# Patient Record
Sex: Male | Born: 1970 | Race: White | Hispanic: No | Marital: Married | Smoking: Former smoker
Health system: Southern US, Community
[De-identification: ages and names within clinical notes are randomized; demographics above are authoritative.]

## PROBLEM LIST (undated history)

## (undated) DIAGNOSIS — Z9981 Dependence on supplemental oxygen: Secondary | ICD-10-CM

## (undated) DIAGNOSIS — I10 Essential (primary) hypertension: Secondary | ICD-10-CM

---

## 2016-02-04 ENCOUNTER — Emergency Department (HOSPITAL_COMMUNITY)
Admission: EM | Admit: 2016-02-04 | Discharge: 2016-02-05 | Disposition: A | Discharge status: Home / Self Care | Payer: Medicare Other | Attending: Emergency Medicine | Admitting: Emergency Medicine

## 2016-02-04 ENCOUNTER — Encounter (HOSPITAL_COMMUNITY): Payer: Self-pay | Admitting: *Deleted

## 2016-02-04 DIAGNOSIS — I10 Essential (primary) hypertension: Secondary | ICD-10-CM | POA: Diagnosis not present

## 2016-02-04 DIAGNOSIS — Z79899 Other long term (current) drug therapy: Secondary | ICD-10-CM | POA: Insufficient documentation

## 2016-02-04 DIAGNOSIS — Z87891 Personal history of nicotine dependence: Secondary | ICD-10-CM | POA: Diagnosis not present

## 2016-02-04 DIAGNOSIS — R0602 Shortness of breath: Secondary | ICD-10-CM

## 2016-02-04 HISTORY — DX: Cystic fibrosis, unspecified: E84.9

## 2016-02-04 HISTORY — DX: Dependence on supplemental oxygen: Z99.81

## 2016-02-04 HISTORY — DX: Essential (primary) hypertension: I10
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
## 2016-02-05 DIAGNOSIS — R0602 Shortness of breath: Secondary | ICD-10-CM | POA: Diagnosis not present

## 2016-02-05 MED ORDER — ALBUTEROL SULFATE HFA 108 (90 BASE) MCG/ACT IN AERS
2.0000 | INHALATION_SPRAY | RESPIRATORY_TRACT | Status: AC
  Administered 2016-02-05: 2 via RESPIRATORY_TRACT
  Filled 2016-02-05: qty 6.7

## 2016-02-05 NOTE — ED Provider Notes (Signed)
CSN: [id number]     Arrival date & time 02/04/16  2343 History  By signing my name below, I, [facility], attest that this documentation has been prepared under the direction and in the presence of Donald HongBrian Damia Bobrowski, MD. Electronically Signed: Randell PatientMarrissa Gomez, ED Scribe. 02/05/2016. 12:20 AM.   Chief Complaint  Gomez presents with  . Shortness of Breath    The history is provided by the Gomez. No language interpreter was used.   HPI Comments: Donald Gomez is a 45 y.o. male who presents to the Emergency Department complaining of mild, unchanging SOB onset earlier today. Pt states that he has CF and notes that he returned from a trip from [place] without his medications. He regularly takes an inhaled steroid and last took his medications 1 day ago at 4 PM. He is on home O2 at night. Denies fevers, chills, nausea, vomiting, diarrhea, abdominal pain, blurred vision, CP, back pain, neck pain, or any other symptoms currently.  Past Medical History  Diagnosis Date  . Cystic fibrosis (HCC)   . Hypertension   . Oxygen dependent     2 liters at night,    History reviewed. No pertinent past surgical history. No family history on file. Social History  Substance Use Topics  . Smoking status: Former Games developermoker  . Smokeless tobacco: None  . Alcohol Use: No    Review of Systems  Constitutional: Negative for fever and chills.  Respiratory: Positive for shortness of breath.   Cardiovascular: Negative for chest pain.  Gastrointestinal: Negative for nausea, vomiting, abdominal pain and diarrhea.  Musculoskeletal: Negative for back pain and neck pain.  All other systems reviewed and are negative.     Allergies  Review of Gomez's allergies indicates no known allergies.  Home Medications   Prior to Admission medications   Medication Sig Start Date End Date Taking? Authorizing Provider  albuterol-ipratropium (COMBIVENT) 18-103 MCG/ACT inhaler Inhale into the lungs every 4 (four) hours.    Yes Historical Provider, MD   BP 121/101 mmHg  Pulse 113  Temp(Src) 98.3 F (36.8 C) (Oral)  Resp 24  Ht 5\' 6"  (1.676 m)  Wt 221 lb (100.245 kg)  BMI 35.69 kg/m2  SpO2 92% Physical Exam  Constitutional: He appears well-developed and well-nourished.  HENT:  Head: Normocephalic and atraumatic.  Eyes: Conjunctivae are normal. Right eye exhibits no discharge. Left eye exhibits no discharge.  Pulmonary/Chest: Effort normal. No respiratory distress. He has wheezes.  Mild diffuse expiratory wheezing. Otherwise benign pulmonary exam without respiratory distress.  Neurological: He is alert. Coordination normal.  Skin: Skin is warm and dry. No rash noted. He is not diaphoretic. No erythema.  Psychiatric: He has a normal mood and affect.  Nursing note and vitals reviewed.   ED Course  Procedures   DIAGNOSTIC STUDIES: Oxygen Saturation is 92% on RA, normal  by my interpretation.    COORDINATION OF CARE: 12:13 AM Will order albuterol inhaler. Will discharge pt. Discussed treatment plan with pt at bedside and pt agreed to plan.   Labs Review Labs Reviewed - No data to display  Imaging Review No results found. I have personally reviewed and evaluated these images and lab results as part of my medical decision-making.    MDM   Final diagnoses:  Shortness of breath    meds refilled - requesting albuterol - no distress - on baseline O2 and basline sat's according to pt - no new sx.  I personally performed the services described in this documentation, which  was scribed in my presence. The recorded information has been reviewed and is accurate.       Donald HongBrian Jaloni Davoli, MD 02/05/16 2337

## 2016-02-05 NOTE — Discharge Instructions (Signed)
Please obtain all of your results from medical records or have your doctors office obtain the results - share them with your doctor - you should be seen at your doctors office in the next 2 days. Call today to arrange your follow up. Take the medications as prescribed. Please review all of the medicines and only take them if you do not have an allergy to them. Please be aware that if you are taking birth control pills, taking other prescriptions, ESPECIALLY ANTIBIOTICS may make the birth control ineffective - if this is the case, either do not engage in sexual activity or use alternative methods of birth control such as condoms until you have finished the medicine and your family doctor says it is OK to restart them. If you are on a blood thinner such as COUMADIN, be aware that any other medicine that you take may cause the coumadin to either work too much, or not enough - you should have your coumadin level rechecked in next 7 days if this is the case.  °?  °It is also a possibility that you have an allergic reaction to any of the medicines that you have been prescribed - Everybody reacts differently to medications and while MOST people have no trouble with most medicines, you may have a reaction such as nausea, vomiting, rash, swelling, shortness of breath. If this is the case, please stop taking the medicine immediately and contact your physician.  °?  °You should return to the ER if you develop severe or worsening symptoms.  ° °[facility] Doctor List ° ° ° °Edward Hawkins MD. Specialty: Pulmonary Disease Contact information: [address]  °[address]  °[place] [address]  °[phone]  ° °Margaret Simpson, MD. Specialty: Family Medicine Contact information: [address]  °[place] [address]  °[phone]  ° °Scott Luking, MD. Specialty: Family Medicine Contact information: [address]  °[address]  °[place] [address]  °[phone]  ° °Tesfaye Fanta, MD Specialty:  Internal Medicine Contact information: [address]  °[place] [address]  °[phone]  ° °Zach Hall, MD. Specialty: Internal Medicine Contact information: [address]  °[place] [address]  °[phone]  ° °Angus Mcinnis, MD. Specialty: Family Medicine Contact information: [address]  °[place] [address]  °[phone]  ° °Stephen Knowlton, MD. Specialty: Family Medicine Contact information: [address]  °[address]  °[place] [address]  °[phone]  ° °Roy Fagan, MD. Specialty: Internal Medicine Contact information: [address]  °[address]  °  [address]  °[phone]  ° °

## 2016-08-05 ENCOUNTER — Emergency Department (HOSPITAL_COMMUNITY)
Admission: EM | Admit: 2016-08-05 | Discharge: 2016-08-05 | Disposition: A | Discharge status: Home / Self Care | Payer: Medicare (Managed Care) | Attending: Emergency Medicine | Admitting: Emergency Medicine

## 2016-08-05 ENCOUNTER — Encounter (HOSPITAL_COMMUNITY): Payer: Self-pay | Admitting: Emergency Medicine

## 2016-08-05 DIAGNOSIS — I10 Essential (primary) hypertension: Secondary | ICD-10-CM | POA: Insufficient documentation

## 2016-08-05 DIAGNOSIS — Z760 Encounter for issue of repeat prescription: Secondary | ICD-10-CM

## 2016-08-05 DIAGNOSIS — Z87891 Personal history of nicotine dependence: Secondary | ICD-10-CM | POA: Diagnosis not present

## 2016-08-05 MED ORDER — ALBUTEROL SULFATE HFA 108 (90 BASE) MCG/ACT IN AERS
2.0000 | INHALATION_SPRAY | Freq: Once | RESPIRATORY_TRACT | Status: AC
  Administered 2016-08-05: 2 via RESPIRATORY_TRACT
  Filled 2016-08-05: qty 6.7

## 2016-08-05 NOTE — ED Triage Notes (Signed)
Pt here for inhaler re-fill, no symptoms at this time.  Pt is in process of getting medicaid re-established and this is the only way he can get inhaler re-filled at this time.

## 2016-08-05 NOTE — Discharge Instructions (Signed)
Try to contact one of the clinics listed to establish a primary care provider.

## 2016-08-05 NOTE — ED Provider Notes (Signed)
[facility] Provider Note   CSN: [id number] Arrival date & time: 08/05/16  1254  By signing my name below, I, Vista Minkobert Ross, attest that this documentation has been prepared under the direction and in the presence of Oaklie Durrett PA-C  Electronically Signed: Vista Minkobert Ross, ED Scribe. 08/05/16. 1:28 PM.  History   Chief Complaint Chief Complaint  Patient presents with  . Medication Refill    HPI HPI Comments: Rennis GoldenBrian Caniglia is a 45 y.o. male, with Hx of cystic fibrosis, HTN, who presents to the Emergency Department requesting a refill of his Ventolin inhaler. He is directed to use his inhaler q4h and ran out of his inhaler last night. Pt states that he still has his Symbicort. Pt is in the process of getting medicaid re-established after [place], which is why he is unable to refill the inhaler on his own. He is currently on medicare through the state of [place]. He is not currently short of breath and denies chest pain, fever, and cough.  Denies any symptoms at present   The history is provided by the patient. No language interpreter was used.    Past Medical History:  Diagnosis Date  . Cystic fibrosis (HCC)   . Hypertension   . 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   History reviewed. No pertinent surgical history.     Home Medications    Prior to Admission medications   Medication Sig Start Date End Date Taking? Authorizing Provider  albuterol-ipratropium (COMBIVENT) 18-103 MCG/ACT inhaler Inhale into the lungs every 4 (four) hours.    Historical Provider, MD    Family History History reviewed. No pertinent family history.  Social History Social History  Substance Use Topics  . Smoking status: Former Games developermoker  . Smokeless tobacco: Not on file  . Alcohol use No    Allergies   Penicillins   Review of Systems Review of Systems  Constitutional:  Negative for fever.  Respiratory: Negative for cough, chest tightness, shortness of breath and wheezing.   Cardiovascular: Negative for chest pain.  Gastrointestinal: Negative for abdominal pain, nausea and vomiting.  Musculoskeletal: Negative for arthralgias.  Skin: Negative for rash.  Neurological: Negative for headaches.     Physical Exam Updated Vital Signs BP 127/70 (BP Location: Left Arm)   Pulse 94   Temp 97.7 F (36.5 C) (Oral)   Resp 18   Ht 5\' 6"  (1.676 m)   Wt 212 lb (96.2 kg)   SpO2 98%   BMI 34.22 kg/m   Physical Exam  Constitutional: He is oriented to person, place, and time. He appears well-developed and well-nourished. No distress.  HENT:  Head: Normocephalic and atraumatic.  Mouth/Throat: Oropharynx is clear and moist.  Neck: Normal range of motion.  Cardiovascular: Normal rate, regular rhythm and normal heart sounds.   No murmur heard. Pulmonary/Chest: Effort normal. No respiratory distress. He has no wheezes. He has no rales.  Mildly diminished lung sounds bilaterally. No rales or wheezing. No respiratory distress noted  Musculoskeletal: Normal range of motion. He exhibits no edema.  Neurological: He is alert and oriented to person, place, and time.  Skin: Skin is warm and dry. He is not diaphoretic.  Psychiatric: He has a normal mood and affect. Judgment normal.  Nursing note and vitals reviewed.   ED Treatments / Results  DIAGNOSTIC STUDIES: Oxygen Saturation is 98% on  RA, normal by my interpretation.  COORDINATION OF CARE: 1:23 PM-Discussed treatment plan with pt at bedside and pt agreed to plan.   Labs (all labs ordered are listed, but only abnormal results are displayed) Labs Reviewed - No data to display  EKG  EKG Interpretation None       Radiology No results found.  Procedures Procedures (including critical care time)  Medications Ordered in ED Medications - No data to display   Initial Impression / Assessment and Plan /  ED Course  I have reviewed the triage vital signs and the nursing notes.  Pertinent labs & imaging results that were available during my care of the patient were reviewed by me and considered in my medical decision making (see chart for details).  Clinical Course     Pt well appearing, vitals stable.  Hx of CF, out of his albuterol inhaler since last evening.  Denies sx's at present.  Requesting referral info to establish primary care.  Info given.  Appears stable for d/c  Final Clinical Impressions(s) / ED Diagnoses   Final diagnoses:  Encounter for medication refill    New Prescriptions New Prescriptions   No medications on file    I personally performed the services described in this documentation, which was scribed in my presence. The recorded information has been reviewed and is accurate.     Pauline Ausammy Ezreal Turay, PA-C 08/05/16 1507    Azalia BilisKevin Campos, MD 08/05/16 [phone]

## 2016-08-27 ENCOUNTER — Emergency Department (HOSPITAL_COMMUNITY)
Admission: EM | Admit: 2016-08-27 | Discharge: 2016-08-27 | Disposition: A | Discharge status: Home / Self Care | Payer: Medicare (Managed Care) | Attending: Emergency Medicine | Admitting: Emergency Medicine

## 2016-08-27 ENCOUNTER — Encounter (HOSPITAL_COMMUNITY): Payer: Self-pay

## 2016-08-27 DIAGNOSIS — I10 Essential (primary) hypertension: Secondary | ICD-10-CM | POA: Insufficient documentation

## 2016-08-27 DIAGNOSIS — F17220 Nicotine dependence, chewing tobacco, uncomplicated: Secondary | ICD-10-CM | POA: Diagnosis not present

## 2016-08-27 DIAGNOSIS — Z760 Encounter for issue of repeat prescription: Secondary | ICD-10-CM

## 2016-08-27 MED ORDER — ALBUTEROL SULFATE HFA 108 (90 BASE) MCG/ACT IN AERS
2.0000 | INHALATION_SPRAY | Freq: Once | RESPIRATORY_TRACT | Status: AC
  Administered 2016-08-27: 2 via RESPIRATORY_TRACT
  Filled 2016-08-27: qty 6.7

## 2016-08-27 NOTE — ED Provider Notes (Signed)
[facility] Provider Note   CSN: [id number] Arrival date & time: 08/27/16  2015     History   Chief Complaint Chief Complaint  Patient presents with  . Medication Refill    HPI Donald Gomez is a 46 y.o. male.  HPI  Donald Gomez is a 46 y.o. male who presents to the Emergency Department requesting an albuterol inhaler.  Patient has hx of cystic fibrosis.  He was seen here last month and given an inhaler and referral info for one of the local primary care clinics.  He states that he contacted the clinic, but is unable to be seen until February.  He ran out of his inhaler on the day of arrival.  He denies chest pain or shortness of breath at present.     Past Medical History:  Diagnosis Date  . Cystic fibrosis (HCC)   . Hypertension   . 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   History reviewed. No pertinent surgical history.     Home Medications    Prior to Admission medications   Medication Sig Start Date End Date Taking? Authorizing Provider  albuterol-ipratropium (COMBIVENT) 18-103 MCG/ACT inhaler Inhale into the lungs every 4 (four) hours.    Historical Provider, MD    Family History History reviewed. No pertinent family history.  Social History Social History  Substance Use Topics  . Smoking status: Former Games developer  . Smokeless tobacco: Current User    Types: Chew  . Alcohol use No     Allergies   Penicillins   Review of Systems Review of Systems  Constitutional: Negative for chills and fever.  HENT: Negative for sore throat and trouble swallowing.   Respiratory: Negative for cough, shortness of breath and wheezing.   Cardiovascular: Negative for chest pain and palpitations.  Musculoskeletal: Negative for arthralgias.  Skin: Negative for rash.  Neurological: Negative for dizziness, weakness and numbness.     Physical Exam Updated Vital Signs BP 147/86 (BP Location: Right Arm)    Pulse 95   Temp 97.9 F (36.6 C) (Oral)   Resp 18   Ht 5\' 6"  (1.676 m)   Wt 96.2 kg   SpO2 95%   BMI 34.22 kg/m   Physical Exam  Constitutional: He is oriented to person, place, and time. He appears well-developed and well-nourished. No distress.  HENT:  Head: Normocephalic.  Mouth/Throat: Oropharynx is clear and moist.  Neck: Normal range of motion. Neck supple. No JVD present. No Kernig's sign noted. No thyromegaly present.  Cardiovascular: Normal rate, regular rhythm and normal heart sounds.   Pulmonary/Chest: Effort normal and breath sounds normal. No respiratory distress. He has no wheezes. He has no rales.  Abdominal: Normal appearance.  Musculoskeletal: Normal range of motion. He exhibits no edema.  Neurological: He is alert and oriented to person, place, and time.  Skin: Skin is warm and dry. No rash noted.     ED Treatments / Results  Labs (all labs ordered are listed, but only abnormal results are displayed) Labs Reviewed - No data to display  EKG  EKG Interpretation None       Radiology No results found.  Procedures Procedures (including critical care time)  Medications Ordered in ED Medications  albuterol (PROVENTIL HFA;VENTOLIN HFA) 108 (90 Base) MCG/ACT inhaler 2 puff (not administered)     Initial Impression / Assessment and Plan / ED Course  I have reviewed  the triage vital signs and the nursing notes.  Pertinent labs & imaging results that were available during my care of the patient were reviewed by me and considered in my medical decision making (see chart for details).     Pt here for same in December.  States he has appt mid February with [facility].    Pt asymptomatic at present.  Ran out of inhaler.  Albuterol MDI dispensed.     Final Clinical Impressions(s) / ED Diagnoses   Final diagnoses:  Encounter for medication refill    New Prescriptions New Prescriptions   No medications on file     Rosey Bathammy Taje Littler,  PA-C 08/29/16 1310    Bethann BerkshireJoseph Zammit, MD 08/30/16 [phone]

## 2016-08-27 NOTE — ED Triage Notes (Signed)
Patient states that he got into [facility] but they cannot see him until February.  Patient states that he needs a refill on his inhaler.  States that he uses his inhaler 2 puffs every 4-6 hours due to cystic fibrosis.

## 2016-08-27 NOTE — Discharge Instructions (Signed)
Keep your appt with [facility].  You can also try contacting the clinic listed to establish primary care

## 2016-09-18 ENCOUNTER — Emergency Department (HOSPITAL_COMMUNITY)
Admission: EM | Admit: 2016-09-18 | Discharge: 2016-09-18 | Disposition: A | Discharge status: Home / Self Care | Payer: Medicare (Managed Care) | Attending: Emergency Medicine | Admitting: Emergency Medicine

## 2016-09-18 ENCOUNTER — Encounter (HOSPITAL_COMMUNITY): Payer: Self-pay | Admitting: Emergency Medicine

## 2016-09-18 DIAGNOSIS — I10 Essential (primary) hypertension: Secondary | ICD-10-CM | POA: Insufficient documentation

## 2016-09-18 DIAGNOSIS — Z79899 Other long term (current) drug therapy: Secondary | ICD-10-CM | POA: Diagnosis not present

## 2016-09-18 DIAGNOSIS — F17220 Nicotine dependence, chewing tobacco, uncomplicated: Secondary | ICD-10-CM | POA: Insufficient documentation

## 2016-09-18 DIAGNOSIS — Z760 Encounter for issue of repeat prescription: Secondary | ICD-10-CM | POA: Insufficient documentation

## 2016-09-18 MED ORDER — ALBUTEROL SULFATE HFA 108 (90 BASE) MCG/ACT IN AERS
2.0000 | INHALATION_SPRAY | Freq: Once | RESPIRATORY_TRACT | Status: AC
  Administered 2016-09-18: 2 via RESPIRATORY_TRACT
  Filled 2016-09-18: qty 6.7

## 2016-09-18 NOTE — Discharge Instructions (Signed)
Your oxygen level is 95% on room air. Your temperature and blood pressure both within normal limits. Please keep the appointment with the clinic as scheduled. Use your albuterol, 2 puffs every 4 hours as needed.

## 2016-09-18 NOTE — ED Provider Notes (Signed)
[facility] Provider Note   CSN: [id number] Arrival date & time: 09/18/16  1032     History   Chief Complaint Chief Complaint  Patient presents with  . Medication Refill    HPI Donald Gomez is a 46 y.o. male.  Patient is a 46 year old male who presents to the emergency department with a request for medication refill  The patient states that he has cystic fibrosis. He is oxygen dependent on. He states that he moved from [place] sometime back. He was under the impression that his insurance would transfer, but he states that he was unable to qualify for his previous insurance. He is having a problem getting to see a physician. He was seen in the emergency department in January of this year for medication refill on, including his albuterol. He states that he has an appointment in just over a week. He states however he ran out of his albuterol but he was given on January 24 on last evening and he is fearful of running out of his medication. He states that when he has attacks with his lungs related to his cystic fibrosis he also has panic attacks which makes the situation worse. He denies any hemoptysis. He denies any fever or chills. There's been no recent injury or trauma to his chest. He presents now for medication refill.   The history is provided by the patient.  Medication Refill    Past Medical History:  Diagnosis Date  . Cystic fibrosis (HCC)   . Hypertension   . 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   History reviewed. No pertinent surgical history.     Home Medications    Prior to Admission medications   Medication Sig Start Date End Date Taking? Authorizing Provider  albuterol-ipratropium (COMBIVENT) 18-103 MCG/ACT inhaler Inhale into the lungs every 4 (four) hours.    Historical Provider, MD    Family History History reviewed. No pertinent family history.  Social History Social History    Substance Use Topics  . Smoking status: Former Games developer  . Smokeless tobacco: Current User    Types: Chew  . Alcohol use No     Allergies   Penicillins   Review of Systems Review of Systems  Constitutional: Negative for activity change.       All ROS Neg except as noted in HPI  HENT: Negative for nosebleeds.   Eyes: Negative for photophobia and discharge.  Respiratory: Positive for cough and shortness of breath. Negative for wheezing.   Cardiovascular: Negative for chest pain and palpitations.  Gastrointestinal: Negative for abdominal pain and blood in stool.  Genitourinary: Negative for dysuria, frequency and hematuria.  Musculoskeletal: Negative for arthralgias, back pain and neck pain.  Skin: Negative.   Neurological: Negative for dizziness, seizures and speech difficulty.  Psychiatric/Behavioral: Negative for confusion and hallucinations.     Physical Exam Updated Vital Signs BP 143/73 (BP Location: Left Arm)   Pulse 101   Temp 98.8 F (37.1 C) (Oral)   Resp 20   Ht 5\' 6"  (1.676 m)   Wt 96.2 kg   SpO2 95%   BMI 34.22 kg/m   Physical Exam  Constitutional: He is oriented to person, place, and time. He appears well-developed and well-nourished.  Non-toxic appearance.  HENT:  Head: Normocephalic.  Right Ear: Tympanic membrane and external ear normal.  Left Ear: Tympanic membrane and external ear normal.  Eyes:  EOM and lids are normal. Pupils are equal, round, and reactive to light.  Neck: Normal range of motion. Neck supple. Carotid bruit is not present.  Cardiovascular: Normal rate, regular rhythm, normal heart sounds, intact distal pulses and normal pulses.   Pulmonary/Chest: Effort normal and breath sounds normal. No respiratory distress. He has no wheezes. He has no rales.  Symmetrical rise and fall of the chest. There are coarse breath sounds present, but no consolidation appreciated.  Abdominal: Soft. Bowel sounds are normal. There is no tenderness. There is  no guarding.  Musculoskeletal: Normal range of motion.  Lymphadenopathy:       Head (right side): No submandibular adenopathy present.       Head (left side): No submandibular adenopathy present.    He has no cervical adenopathy.  Neurological: He is alert and oriented to person, place, and time. He has normal strength. No cranial nerve deficit or sensory deficit.  Skin: Skin is warm and dry.  Psychiatric: He has a normal mood and affect. His speech is normal.  Nursing note and vitals reviewed.    ED Treatments / Results  Labs (all labs ordered are listed, but only abnormal results are displayed) Labs Reviewed - No data to display  EKG  EKG Interpretation None       Radiology No results found.  Procedures Procedures (including critical care time)  Medications Ordered in ED Medications  albuterol (PROVENTIL HFA;VENTOLIN HFA) 108 (90 Base) MCG/ACT inhaler 2 puff (not administered)     Initial Impression / Assessment and Plan / ED Course  I have reviewed the triage vital signs and the nursing notes.  Pertinent labs & imaging results that were available during my care of the patient were reviewed by me and considered in my medical decision making (see chart for details).     **I have reviewed nursing notes, vital signs, and all appropriate lab and imaging results for this patient.*  Final Clinical Impressions(s) / ED Diagnoses  MDM Patient states he's been diagnosed with cystic fibrosis. He uses albuterol inhaler on a regular basis. He is currently out of his inhaler that he was issued in January of this year from the emergency department. He states however he was able to arrange an appointment with one of the clinics in less than a week. He presents requesting a refill on his albuterol. Respiratory therapy has been called to issue an albuterol inhaler. The patient will follow-up with the clinic physician at that time. The patient is advised to return to the emergency  department if any emergent changes, problems, or concerns.    Final diagnoses:  Medication refill  Cystic fibrosis [facility](HCC)    New Prescriptions New Prescriptions   No medications on file     Ivery QualeHobson Fernandez Kenley, Cordelia Poche-C 09/18/16 1207    Loren Raceravid Yelverton, MD 09/20/16 [phone]

## 2016-09-18 NOTE — ED Triage Notes (Signed)
Pt here today to have inhaler refilled, ran out this morning.  Pt has cystic fibrosis and has to use inhaler frequently, not having any symptoms today.  Pt alert and oriented.   Pt recently moved from [place] and is seeing doctor this Thursday to refill all of medications.

## 2016-10-27 ENCOUNTER — Emergency Department (HOSPITAL_COMMUNITY): Payer: Medicare (Managed Care)

## 2016-10-27 ENCOUNTER — Emergency Department (HOSPITAL_COMMUNITY)
Admission: EM | Admit: 2016-10-27 | Discharge: 2016-10-27 | Disposition: A | Discharge status: Home / Self Care | Payer: Medicare (Managed Care) | Attending: Emergency Medicine | Admitting: Emergency Medicine

## 2016-10-27 ENCOUNTER — Encounter (HOSPITAL_COMMUNITY): Payer: Self-pay | Admitting: Emergency Medicine

## 2016-10-27 DIAGNOSIS — I10 Essential (primary) hypertension: Secondary | ICD-10-CM | POA: Diagnosis not present

## 2016-10-27 DIAGNOSIS — R1013 Epigastric pain: Secondary | ICD-10-CM | POA: Diagnosis not present

## 2016-10-27 DIAGNOSIS — R059 Cough, unspecified: Secondary | ICD-10-CM

## 2016-10-27 DIAGNOSIS — F17220 Nicotine dependence, chewing tobacco, uncomplicated: Secondary | ICD-10-CM | POA: Insufficient documentation

## 2016-10-27 DIAGNOSIS — Z79899 Other long term (current) drug therapy: Secondary | ICD-10-CM | POA: Diagnosis not present

## 2016-10-27 DIAGNOSIS — R05 Cough: Secondary | ICD-10-CM | POA: Insufficient documentation

## 2016-10-27 MED ORDER — ALBUTEROL SULFATE HFA 108 (90 BASE) MCG/ACT IN AERS
2.0000 | INHALATION_SPRAY | Freq: Once | RESPIRATORY_TRACT | Status: AC
  Administered 2016-10-27: 2 via RESPIRATORY_TRACT

## 2016-10-27 MED ORDER — ALBUTEROL SULFATE HFA 108 (90 BASE) MCG/ACT IN AERS
INHALATION_SPRAY | RESPIRATORY_TRACT | Status: AC
  Administered 2016-10-27: 2 via RESPIRATORY_TRACT
  Filled 2016-10-27: qty 6.7

## 2016-10-27 MED ORDER — OXYCODONE-ACETAMINOPHEN 5-325 MG PO TABS: 1.0000 | ORAL_TABLET | Freq: Four times a day (QID) | ORAL | 0 refills | Status: AC | PRN

## 2016-10-27 MED ORDER — OXYCODONE-ACETAMINOPHEN 5-325 MG PO TABS
1.0000 | ORAL_TABLET | Freq: Once | ORAL | Status: AC
  Administered 2016-10-27: 1 via ORAL
  Filled 2016-10-27: qty 1

## 2016-10-27 MED ORDER — KETOROLAC TROMETHAMINE 30 MG/ML IJ SOLN
30.0000 mg | Freq: Once | INTRAMUSCULAR | Status: AC
  Administered 2016-10-27: 30 mg via INTRAMUSCULAR

## 2016-10-27 MED ORDER — KETOROLAC TROMETHAMINE 30 MG/ML IJ SOLN
30.0000 mg | Freq: Once | INTRAMUSCULAR | Status: DC
  Filled 2016-10-27: qty 1

## 2016-10-27 MED ORDER — ALBUTEROL SULFATE (2.5 MG/3ML) 0.083% IN NEBU
2.5000 mg | INHALATION_SOLUTION | Freq: Once | RESPIRATORY_TRACT | Status: AC
  Administered 2016-10-27: 2.5 mg via RESPIRATORY_TRACT
  Filled 2016-10-27: qty 3

## 2016-10-27 NOTE — ED Triage Notes (Signed)
Pt reports having a bad cough for the past 3 days.  Had a bad coughing fit and felt something pull in upper abdomen.  Pt states now it hurts to cough or move and cannot get congestion out of chest.

## 2016-10-27 NOTE — ED Provider Notes (Signed)
Emergency Department Provider Note  By signing my name below, I, Donald Gomez, attest that this documentation has been prepared under the direction and in the presence of Donald PlanJoshua G Jerimiah Wolman, MD . Electronically Signed: Majel HomerPeyton Gomez, Scribe. 10/27/2016. 6:48 PM.  I have reviewed the triage vital signs and the nursing notes.  HISTORY  Chief Complaint Cough and Abdominal Pain  HPI Comments: Donald Gomez is a 46 y.o. male with PMHx of cystic fibrosis and HTN, who presents to the Emergency Department complaining of gradually worsening, right-sided abdominal pain secondary to his persistent cough that began ~3 days ago. Pt reports he was having a "coughing fit" 3 days ago when he suddenly experienced a "sharp pain" in his abdomen described as a "muscle strain." He states his pain is exacerbated with movement and when taking a deep breath. He notes associated rhinorrhea and congestion and states he "cannot seem to get the congestion out of his chest." Pt reports he has taken Ibuprofen for his symptoms with no relief. He denies any bulging from his abdomen, fever, chills, nausea, vomiting, diarrhea, shortness of breath, and recent use of EtOH or cigarettes.     Past Medical History:  Diagnosis Date  . Cystic fibrosis (HCC)   . Hypertension   . 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  History reviewed. No pertinent surgical history.  Current Outpatient Rx  . Order #: [id number] Class: Historical Med  . Order #: [id number] Class: Historical Med  . Order #: [id number] Class: Print   Allergies Penicillins  History reviewed. No pertinent family history.  Social History Social History  Substance Use Topics  . Smoking status: Former Games developermoker  . Smokeless tobacco: Current User    Types: Chew  . Alcohol use No    Review of Systems  Constitutional: No fever/chills Eyes: No visual changes. ENT: No sore throat. Cardiovascular: Denies chest  pain. Respiratory: Denies shortness of breath. Positive mild cough.  Gastrointestinal: Positive epigastric abdominal pain.  No nausea, no vomiting.  No diarrhea.  No constipation. Genitourinary: Negative for dysuria. Musculoskeletal: Negative for back pain. Skin: Negative for rash. Neurological: Negative for headaches, focal weakness or numbness.  10-point ROS otherwise negative. ____________________________________________  PHYSICAL EXAM:  VITAL SIGNS: ED Triage Vitals  Enc Vitals Group     BP 10/27/16 0926 (!) 158/86     Pulse Rate 10/27/16 0926 (!) 105     Resp 10/27/16 0926 20     Temp 10/27/16 0926 97.6 F (36.4 C)     Temp Source 10/27/16 0926 Oral     SpO2 10/27/16 0926 94 %     Weight 10/27/16 0926 221 lb (100.2 kg)     Height 10/27/16 0926 5\' 6"  (1.676 m)     Pain Score 10/27/16 0927 10   Constitutional: Alert and oriented. Well appearing and in no acute distress. Eyes: Conjunctivae are normal.  Head: Atraumatic. Nose: No congestion/rhinnorhea. Mouth/Throat: Mucous membranes are moist.  Oropharynx non-erythematous. Neck: No stridor.   Cardiovascular: Normal rate, regular rhythm. Good peripheral circulation. Grossly normal heart sounds.   Respiratory: Normal respiratory effort.  No retractions. Lungs CTAB. Gastrointestinal: Soft and nontender. No distention.  Musculoskeletal: No lower extremity tenderness nor edema. No gross deformities of extremities. Mild left lower chest wall tenderness to palpation.  Neurologic:  Normal speech and language. No gross focal neurologic deficits are appreciated.  Skin:  Skin is warm, dry and intact. No rash noted.  Psychiatric: Mood and affect are normal. Speech and behavior are normal.  ____________________________________________  RADIOLOGY  Dg Ribs Unilateral W/chest Left  Result Date: 10/27/2016 CLINICAL DATA:  Cough and chest pain. Reported history of cystic fibrosis EXAM: LEFT RIBS AND CHEST - 3+ VIEW COMPARISON:  None.  FINDINGS: Frontal chest as well as oblique and cone-down lateral images were obtained. There is scarring in the left base. There is interstitial thickening in the mid lower lung zones bilaterally. There is no airspace consolidation. There is no appreciable bronchiectatic change by radiography. Heart is upper normal in size with pulmonary vascularity within normal limits. No adenopathy. There is no pleural effusion or pneumothorax. No evident rib fracture. IMPRESSION: No evident rib fracture. Scarring left base. Interstitial thickening bilaterally is likely due to chronic inflammatory type change. No edema or consolidation. No adenopathy. Electronically Signed   By: Bretta Bang III M.D.   On: 10/27/2016 10:33  ____________________________________________   PROCEDURES  Procedure(s) performed:   Procedures  None ____________________________________________   INITIAL IMPRESSION / ASSESSMENT AND PLAN / ED COURSE  Pertinent labs & imaging results that were available during my care of the patient were reviewed by me and considered in my medical decision making (see chart for details).  Patient presents to the ED for evaluation of left lower chest wall pain. Mild cough but no worse that normal. Reports that he has had pneumonia many times but does not feel sick. He reports only coming in because of pain with coughing. X-ray of left ribs are normal. Lungs are CTABL with no hypoxemia. Plan for pain control with likely MSK injury from coughing. Referred to PCP with significant underlying medical issues.   At this time, I do not feel there is any life-threatening condition present. I have reviewed and discussed all results (EKG, imaging, lab, urine as appropriate), exam findings with patient. I have reviewed nursing notes and appropriate previous records.  I feel the patient is safe to be discharged home without further emergent workup. Discussed usual and customary return precautions. Patient and  family (if present) verbalize understanding and are comfortable with this plan.  Patient will follow-up with their primary care provider. If they do not have a primary care provider, information for follow-up has been provided to them. All questions have been answered.  ____________________________________________  FINAL CLINICAL IMPRESSION(S) / ED DIAGNOSES  Final diagnoses:  Cough  Epigastric pain   MEDICATIONS GIVEN DURING THIS VISIT:  Medications  albuterol (PROVENTIL) (2.5 MG/3ML) 0.083% nebulizer solution 2.5 mg (2.5 mg Nebulization Given 10/27/16 1139)  ketorolac (TORADOL) 30 MG/ML injection 30 mg (30 mg Intramuscular Given 10/27/16 1101)  oxyCODONE-acetaminophen (PERCOCET/ROXICET) 5-325 MG per tablet 1 tablet (1 tablet Oral Given 10/27/16 1155)  albuterol (PROVENTIL HFA;VENTOLIN HFA) 108 (90 Base) MCG/ACT inhaler 2 puff (2 puffs Inhalation Given 10/27/16 1245)     NEW OUTPATIENT MEDICATIONS STARTED DURING THIS VISIT:  Discharge Medication List as of 10/27/2016 12:02 PM    START taking these medications   Details  oxyCODONE-acetaminophen (PERCOCET/ROXICET) 5-325 MG tablet Take 1 tablet by mouth every 6 (six) hours as needed for severe pain., Starting Sat 10/27/2016, Until Thu 11/01/2016, Print       Note:  This document was prepared using Dragon voice recognition software and may include unintentional dictation errors.  Alona Bene, MD Emergency Medicine  I personally performed the services described in this documentation, which was scribed in my presence. The recorded information has been reviewed and is accurate.      Ivin Booty  Carollee Massed, MD 10/27/16 1850

## 2016-10-27 NOTE — Discharge Instructions (Signed)
We believe that your symptoms are caused by musculoskeletal strain.  Please read through the included information about additional care such as heating pads, over-the-counter pain medicine.  If you were provided a prescription please use it only as needed and as instructed.  Remember that early mobility and using the affected part of your body is actually better than keeping it immobile.  If you develop and worsening cough, fever, chills, or other evidence of pneumonia you should return to the ED immediately.   Follow-up with the doctor listed as recommended or return to the emergency department with new or worsening symptoms that concern you.

## 2016-10-27 NOTE — ED Notes (Signed)
States that wife can drive pt home at discharge

## 2016-10-27 NOTE — ED Notes (Signed)
Pt verbalized understanding of no driving and to use caution within 4 hours of taking pain meds due to meds cause drowsiness 

## 2016-11-25 ENCOUNTER — Encounter (HOSPITAL_COMMUNITY): Payer: Self-pay | Admitting: Emergency Medicine

## 2016-11-25 ENCOUNTER — Emergency Department (HOSPITAL_COMMUNITY)
Admission: EM | Admit: 2016-11-25 | Discharge: 2016-11-25 | Disposition: A | Discharge status: Home / Self Care | Payer: Medicare (Managed Care) | Attending: Emergency Medicine | Admitting: Emergency Medicine

## 2016-11-25 DIAGNOSIS — R05 Cough: Secondary | ICD-10-CM | POA: Diagnosis not present

## 2016-11-25 DIAGNOSIS — F17220 Nicotine dependence, chewing tobacco, uncomplicated: Secondary | ICD-10-CM | POA: Diagnosis not present

## 2016-11-25 DIAGNOSIS — I10 Essential (primary) hypertension: Secondary | ICD-10-CM | POA: Diagnosis not present

## 2016-11-25 DIAGNOSIS — Z760 Encounter for issue of repeat prescription: Secondary | ICD-10-CM | POA: Diagnosis present

## 2016-11-25 MED ORDER — BUDESONIDE-FORMOTEROL FUMARATE 160-4.5 MCG/ACT IN AERO: 2.0000 | INHALATION_SPRAY | Freq: Two times a day (BID) | RESPIRATORY_TRACT | 0 refills | Status: DC

## 2016-11-25 MED ORDER — ALBUTEROL SULFATE HFA 108 (90 BASE) MCG/ACT IN AERS
2.0000 | INHALATION_SPRAY | Freq: Once | RESPIRATORY_TRACT | Status: AC
  Administered 2016-11-25: 2 via RESPIRATORY_TRACT
  Filled 2016-11-25: qty 6.7

## 2016-11-25 MED ORDER — ALBUTEROL SULFATE HFA 108 (90 BASE) MCG/ACT IN AERS: 1.0000 | INHALATION_SPRAY | Freq: Four times a day (QID) | RESPIRATORY_TRACT | 0 refills | Status: DC | PRN

## 2016-11-25 NOTE — ED Triage Notes (Signed)
Patient requesting refill of Albuterol inhaler in which he uses daily. Per patient ran out of inhaler this morning. Patient recently approved for Medicaid and has been trying to find PCP taking doctors.

## 2016-11-25 NOTE — ED Provider Notes (Signed)
[facility] Provider Note   CSN: [id number] Arrival date & time: 11/25/16  1835  By signing my name below, I, Cynda Acres, attest that this documentation has been prepared under the direction and in the presence of Burgess Amor, PA-C. Electronically Signed: Cynda Acres, Scribe. 11/25/16. 7:28 PM.  History   Chief Complaint Chief Complaint  Patient presents with  . Medication Refill   HPI Comments: Donald Gomez is a 46 y.o. male with a history of cystic fibrosis, and hypertension, who presents to the Emergency Department for a medication refill of his albuterol inhaler, last dose this am. He has been using this medicine every four hours since running out of his 250 mg Symbicort inhaler last week. Patient reports an associated  Cough productive of clear sputum. Patient is currently oxygen dependent on 2 liters at bedtime.  Patient has recently been approved for medicaid and will establish care with his new provider at the [facility] in [place] in May, but in the interim, needs assistance with his prescriptions.  Patient denies any trouble breathing, shortness of breath, fever, or chest pain.   The history is provided by the patient. No language interpreter was used.    Past Medical History:  Diagnosis Date  . Cystic fibrosis (HCC)   . Hypertension   . 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   History reviewed. No pertinent surgical history.     Home Medications    Prior to Admission medications   Medication Sig Start Date End Date Taking? Authorizing Provider  albuterol (PROVENTIL HFA;VENTOLIN HFA) 108 (90 Base) MCG/ACT inhaler Inhale 1-2 puffs into the lungs every 6 (six) hours as needed for wheezing or shortness of breath. 11/25/16   Burgess Amor, PA-C  albuterol-ipratropium (COMBIVENT) 18-103 MCG/ACT inhaler Inhale into the lungs every 4 (four) hours.    Historical Provider, MD  budesonide-formoterol  (SYMBICORT) 160-4.5 MCG/ACT inhaler Inhale 2 puffs into the lungs 2 (two) times daily. 11/25/16   Burgess Amor, PA-C    Family History No family history on file.  Social History Social History  Substance Use Topics  . Smoking status: Former Smoker    Types: Cigarettes  . Smokeless tobacco: Current User    Types: Chew  . Alcohol use No     Allergies   Penicillins   Review of Systems Review of Systems  Constitutional: Negative for fever.  HENT: Negative for trouble swallowing.   Respiratory: Positive for cough. Negative for shortness of breath.   Cardiovascular: Negative for chest pain.     Physical Exam Updated Vital Signs BP (!) 155/78 (BP Location: Left Arm)   Pulse 100   Temp 98.3 F (36.8 C) (Oral)   Resp 20   Ht  (1.676 m)   Wt 109.3 kg   SpO2 95%   BMI 38.90 kg/m   Physical Exam  Constitutional: He is oriented to person, place, and time. He appears well-developed.  HENT:  Head: Normocephalic and atraumatic.  Mouth/Throat: Oropharynx is clear and moist.  Eyes: Conjunctivae and EOM are normal. Pupils are equal, round, and reactive to light.  Cardiovascular: Normal rate and regular rhythm.   Pulmonary/Chest: Effort normal. He has no wheezes. He has no rales.  Reduced breath sounds bilaterally without wheezing, rhonchi, or rales.   Musculoskeletal: Normal range of motion.  Neurological: He is alert and oriented to person, place, and time.  Skin: Skin is  warm and dry.  Psychiatric: He has a normal mood and affect.  Nursing note and vitals reviewed.    ED Treatments / Results  DIAGNOSTIC STUDIES: Oxygen Saturation is 95% on RA, normal by my interpretation.    COORDINATION OF CARE: 7:27 PM Discussed treatment plan with pt at bedside and pt agreed to plan, which includes an inhaler refill for Symbicort and albuterol.   Labs (all labs ordered are listed, but only abnormal results are displayed) Labs Reviewed - No data to display  EKG  EKG  Interpretation None       Radiology No results found.  Procedures Procedures (including critical care time)  Medications Ordered in ED Medications  albuterol (PROVENTIL HFA;VENTOLIN HFA) 108 (90 Base) MCG/ACT inhaler 2 puff (2 puffs Inhalation Given 11/25/16 1940)     Initial Impression / Assessment and Plan / ED Course  I have reviewed the triage vital signs and the nursing notes.  Pertinent labs & imaging results that were available during my care of the patient were reviewed by me and considered in my medical decision making (see chart for details).     Pt at his baseline breathing at exam today, simply in need of medication refills, which were given. symbicort and albuterol mdi prescribed.  Final Clinical Impressions(s) / ED Diagnoses   Final diagnoses:  Medication refill    New Prescriptions Discharge Medication List as of 11/25/2016  7:34 PM    START taking these medications   Details  budesonide-formoterol (SYMBICORT) 160-4.5 MCG/ACT inhaler Inhale 2 puffs into the lungs 2 (two) times daily., Starting Sun 11/25/2016, Print       I personally performed the services described in this documentation, which was scribed in my presence. The recorded information has been reviewed and is accurate.     Burgess Amor, PA-C 11/25/16 2357    Samuel Jester, DO 11/28/16 1517

## 2016-12-18 ENCOUNTER — Telehealth: Payer: Self-pay | Admitting: Internal Medicine

## 2016-12-18 ENCOUNTER — Encounter: Payer: Self-pay | Admitting: Internal Medicine

## 2016-12-18 ENCOUNTER — Ambulatory Visit (INDEPENDENT_AMBULATORY_CARE_PROVIDER_SITE_OTHER): Payer: Self-pay | Admitting: Internal Medicine

## 2016-12-18 MED ORDER — ESOMEPRAZOLE MAGNESIUM 40 MG PO CPDR: DELAYED_RELEASE_CAPSULE | ORAL | 11 refills | Status: DC

## 2016-12-18 MED ORDER — BUDESONIDE-FORMOTEROL FUMARATE 160-4.5 MCG/ACT IN AERO: 2.0000 | INHALATION_SPRAY | Freq: Two times a day (BID) | RESPIRATORY_TRACT | 0 refills | Status: DC

## 2016-12-18 MED ORDER — ALBUTEROL SULFATE HFA 108 (90 BASE) MCG/ACT IN AERS: 1.0000 | INHALATION_SPRAY | Freq: Four times a day (QID) | RESPIRATORY_TRACT | 4 refills | Status: DC | PRN

## 2016-12-18 MED ORDER — ESOMEPRAZOLE MAGNESIUM 40 MG PO CPDR: 40.0000 mg | DELAYED_RELEASE_CAPSULE | Freq: Two times a day (BID) | ORAL | 11 refills | Status: DC

## 2016-12-18 NOTE — Patient Instructions (Signed)
[facility].  [facility]  Please establish with one of the above as soon as possible

## 2016-12-18 NOTE — Telephone Encounter (Signed)
See phone documentation

## 2016-12-18 NOTE — Progress Notes (Signed)
   Subjective:    Patient ID: Donald GoldenBrian Gomez, male    DOB: 1970-09-26, 46 y.o.   MRN: [id number]  HPI   Here to establish, but has coverage and does not live in the community. [place] few months ago.   Lived here for short time previously.  1.  Cystic Fibrosis:  Son was diagnosed with CF several years ago.  States they then looked at him for evaluation and he was found to have CF as well. Had been misdiagnosed as asthma. Only utilizing Albuterol and  Was on Pulmozyme years ago, but apparently not helpful Was followed by [place] pulmonologist in [place]. 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 Chews tobacco.  Moved to chew to stop smoking.  Had not been able to quit cigarettes until moved to chew.  Might be willing to try the gum.  Needs to get his meds filled until he can get established with a physician.  States he believes he had the Pneumococcal vaccine in the past. Did not get influenza vaccine this past year.  2.  GERD: Nexium is the only PPI that works for him.  Current Meds  Medication Sig  . albuterol (PROVENTIL HFA;VENTOLIN HFA) 108 (90 Base) MCG/ACT inhaler Inhale 1-2 puffs into the lungs every 6 (six) hours as needed for wheezing or shortness of breath.  . budesonide-formoterol (SYMBICORT) 160-4.5 MCG/ACT inhaler Inhale 2 puffs into the lungs 2 (two) times daily.  Marland Kitchen. esomeprazole (NEXIUM) 40 MG capsule Take 40 mg by mouth 2 (two) times daily before a meal.    Allergies  Allergen Reactions  . Penicillins Other (See Comments)    Unknown  Has patient had a PCN reaction causing immediate rash, facial/tongue/throat swelling, SOB or lightheadedness with hypotension: Nounknown Has patient had a PCN reaction causing severe rash involving mucus membranes or skin necrosis: Nounknown Has patient had a PCN reaction that required hospitalization Nounknown Has patient had a PCN reaction occurring within the last 10 years: No If all of the above  answers are "NO", then may proceed with Cephalosporin use.          Review of Systems     Objective:   Physical Exam NAD Morbidly obese Lungs:  Decreased breath sounds throughout.  Crackles in posterior lower left lung field.   CV:  Mildly tachycardic, RRR without murmur or rub, radial pulses normal and equal.       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 Will fill current meds.   He will find another primary care provider.

## 2017-01-28 ENCOUNTER — Ambulatory Visit: Payer: Medicare (Managed Care) | Admitting: Family Medicine

## 2017-02-25 ENCOUNTER — Encounter (HOSPITAL_COMMUNITY): Payer: Self-pay

## 2017-02-25 ENCOUNTER — Emergency Department (HOSPITAL_COMMUNITY)
Admission: EM | Admit: 2017-02-25 | Discharge: 2017-02-25 | Disposition: A | Discharge status: Home / Self Care | Payer: Medicare (Managed Care) | Attending: Emergency Medicine | Admitting: Emergency Medicine

## 2017-02-25 DIAGNOSIS — I10 Essential (primary) hypertension: Secondary | ICD-10-CM | POA: Insufficient documentation

## 2017-02-25 DIAGNOSIS — K047 Periapical abscess without sinus: Secondary | ICD-10-CM | POA: Diagnosis not present

## 2017-02-25 DIAGNOSIS — Z79899 Other long term (current) drug therapy: Secondary | ICD-10-CM | POA: Insufficient documentation

## 2017-02-25 DIAGNOSIS — Z87891 Personal history of nicotine dependence: Secondary | ICD-10-CM | POA: Insufficient documentation

## 2017-02-25 DIAGNOSIS — K0889 Other specified disorders of teeth and supporting structures: Secondary | ICD-10-CM | POA: Diagnosis present

## 2017-02-25 MED ORDER — AMOXICILLIN 500 MG PO CAPS: 500.0000 mg | ORAL_CAPSULE | Freq: Three times a day (TID) | ORAL | 0 refills | Status: AC

## 2017-02-25 NOTE — Discharge Instructions (Signed)
See your Dentist as scheduled  °

## 2017-02-25 NOTE — ED Provider Notes (Signed)
[facility] Provider Note   CSN: [id number] Arrival date & time: 02/25/17  1807     History   Chief Complaint Chief Complaint  Patient presents with  . Dental Pain    HPI Donald Gomez is a 46 y.o. male.  The history is provided by the patient. No language interpreter was used.  Dental Pain   This is a new problem. The current episode started more than 2 days ago. The problem occurs constantly. The problem has been gradually worsening. The pain is moderate. He has tried nothing for the symptoms. The treatment provided no relief.  Pt reports he has an appointment to get his teeth pulled.  Pt thinks one of his teeth is infected.  Pt is requesting an antibiotic so he can get teeth pulled on schedule  Past Medical History:  Diagnosis Date  . Cystic fibrosis (HCC)   . Hypertension   . 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 Cystic fibrosis (HCC) 12/18/2016    History reviewed. No pertinent surgical history.     Home Medications    Prior to Admission medications   Medication Sig Start Date End Date Taking? Authorizing Provider  albuterol (PROVENTIL HFA;VENTOLIN HFA) 108 (90 Base) MCG/ACT inhaler Inhale 1-2 puffs into the lungs every 6 (six) hours as needed for wheezing or shortness of breath. 12/18/16   Julieanne Manson, MD  amoxicillin (AMOXIL) 500 MG capsule Take 1 capsule (500 mg total) by mouth 3 (three) times daily. 02/25/17   Elson Areas, PA-C  budesonide-formoterol (SYMBICORT) 160-4.5 MCG/ACT inhaler Inhale 2 puffs into the lungs 2 (two) times daily. 12/18/16   Julieanne Manson, MD  esomeprazole (NEXIUM) 40 MG capsule 1 cap by mouth on empty stomach once daily 12/18/16   Julieanne Manson, MD    Family History No family history on file.  Social History Social History  Substance Use Topics  . Smoking status: Former Smoker    Packs/day: 1.00    Years: 10.00    Types: Cigarettes  .  Smokeless tobacco: Current User    Types: Chew  . Alcohol use No     Allergies   Penicillins   Review of Systems Review of Systems  All other systems reviewed and are negative.    Physical Exam Updated Vital Signs BP (!) 158/82 (BP Location: Right Arm)   Pulse (!) 103   Temp 98 F (36.7 C) (Oral)   Resp 17   Ht 5\' 6"  (1.676 m)   Wt 109.3 kg (241 lb)   SpO2 95%   BMI 38.90 kg/m   Physical Exam  Constitutional: He appears well-developed and well-nourished.  HENT:  Head: Normocephalic and atraumatic.  Dental decay. Swelling gumline  Eyes: Conjunctivae are normal.  Neck: Neck supple.  Cardiovascular: Normal rate and regular rhythm.   No murmur heard. Pulmonary/Chest: Effort normal and breath sounds normal. No respiratory distress.  Abdominal: Soft. There is no tenderness.  Musculoskeletal: He exhibits no edema.  Neurological: He is alert.  Skin: Skin is warm and dry.  Psychiatric: He has a normal mood and affect.  Nursing note and vitals reviewed.    ED Treatments / Results  Labs (all labs ordered are listed, but only abnormal results are displayed) Labs Reviewed - No data to display  EKG  EKG Interpretation None       Radiology No results found.  Procedures Procedures (including critical care time)  Medications Ordered in ED Medications - No data to display   Initial Impression / Assessment and Plan / ED Course  I have reviewed the triage vital signs and the nursing notes.  Pertinent labs & imaging results that were available during my care of the patient were reviewed by me and considered in my medical decision making (see chart for details).       Final Clinical Impressions(s) / ED Diagnoses   Final diagnoses:  Dental infection    New Prescriptions New Prescriptions   AMOXICILLIN (AMOXIL) 500 MG CAPSULE    Take 1 capsule (500 mg total) by mouth 3 (three) times daily.  An After Visit Summary was printed and given to the  patient.    Elson AreasSofia, Jordie Schreur K, PA-C 02/25/17 1928    Elson AreasSofia, Lindalou Soltis K, PA-C 02/25/17 1930    Samuel JesterMcManus, Kathleen, DO 03/01/17 [phone]

## 2017-02-25 NOTE — ED Triage Notes (Signed)
Reports of right lower tooth pain that started this morning. Reports of infection taste in mouth.

## 2017-06-21 IMAGING — DX DG RIBS W/ CHEST 3+V*L*
4 series · 4 of 4 positions shown · non-contrast
Comparison: None.

CLINICAL DATA: Cough and chest pain. Reported history of cystic
fibrosis

EXAM:
LEFT RIBS AND CHEST - 3+ VIEW

[chest pa]
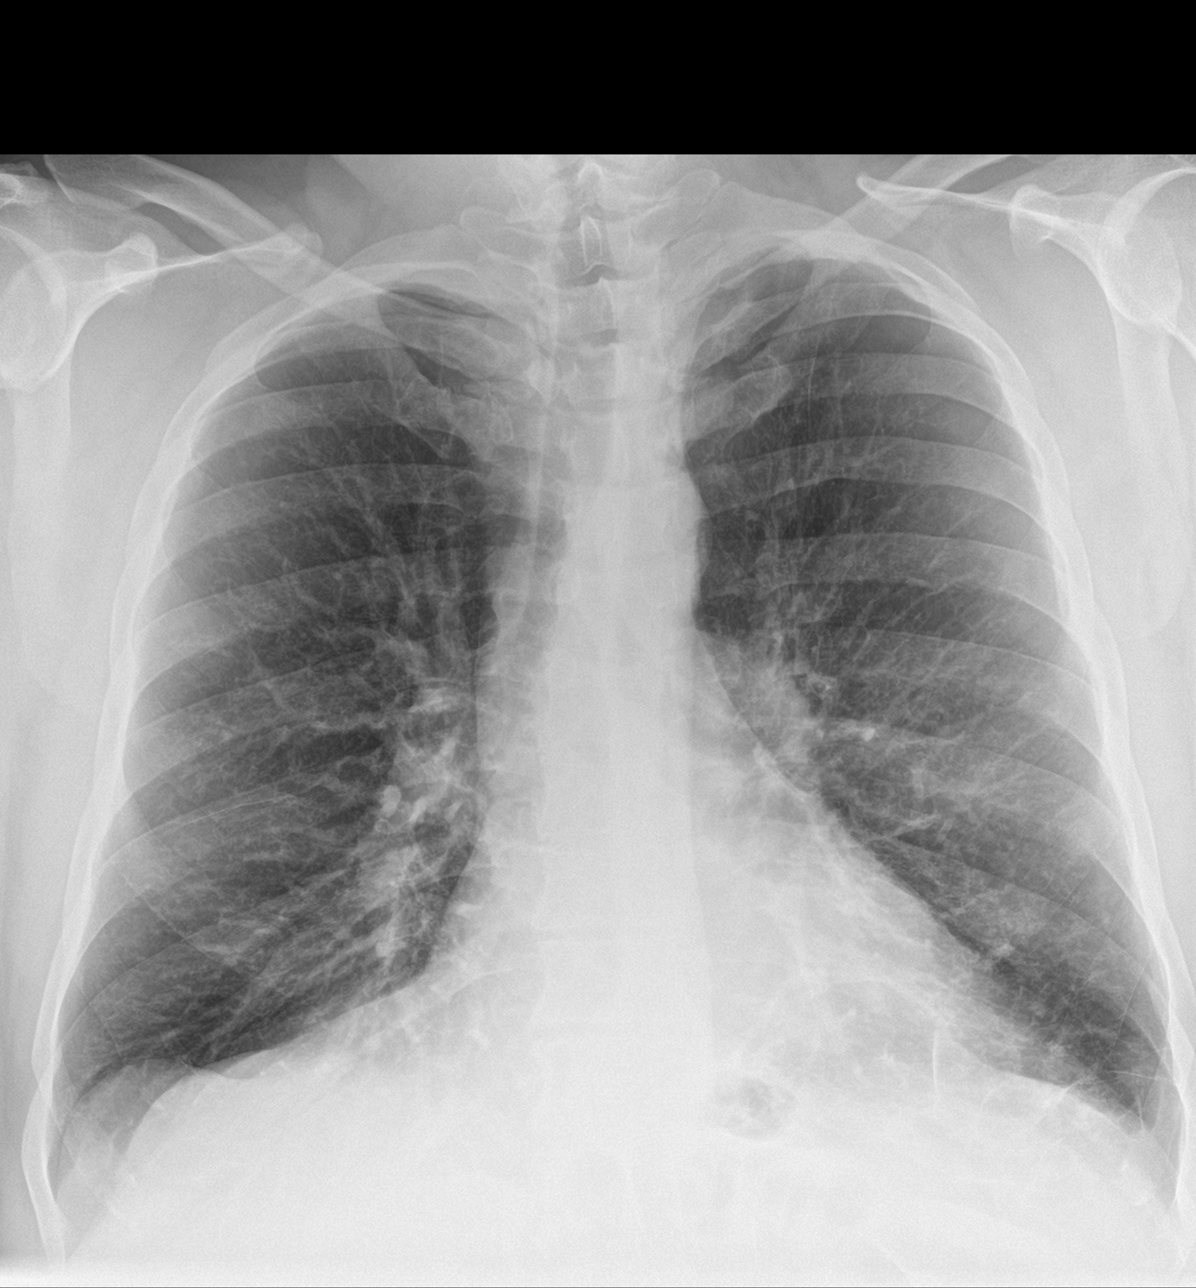

[rib pa obl (1 of 2)]
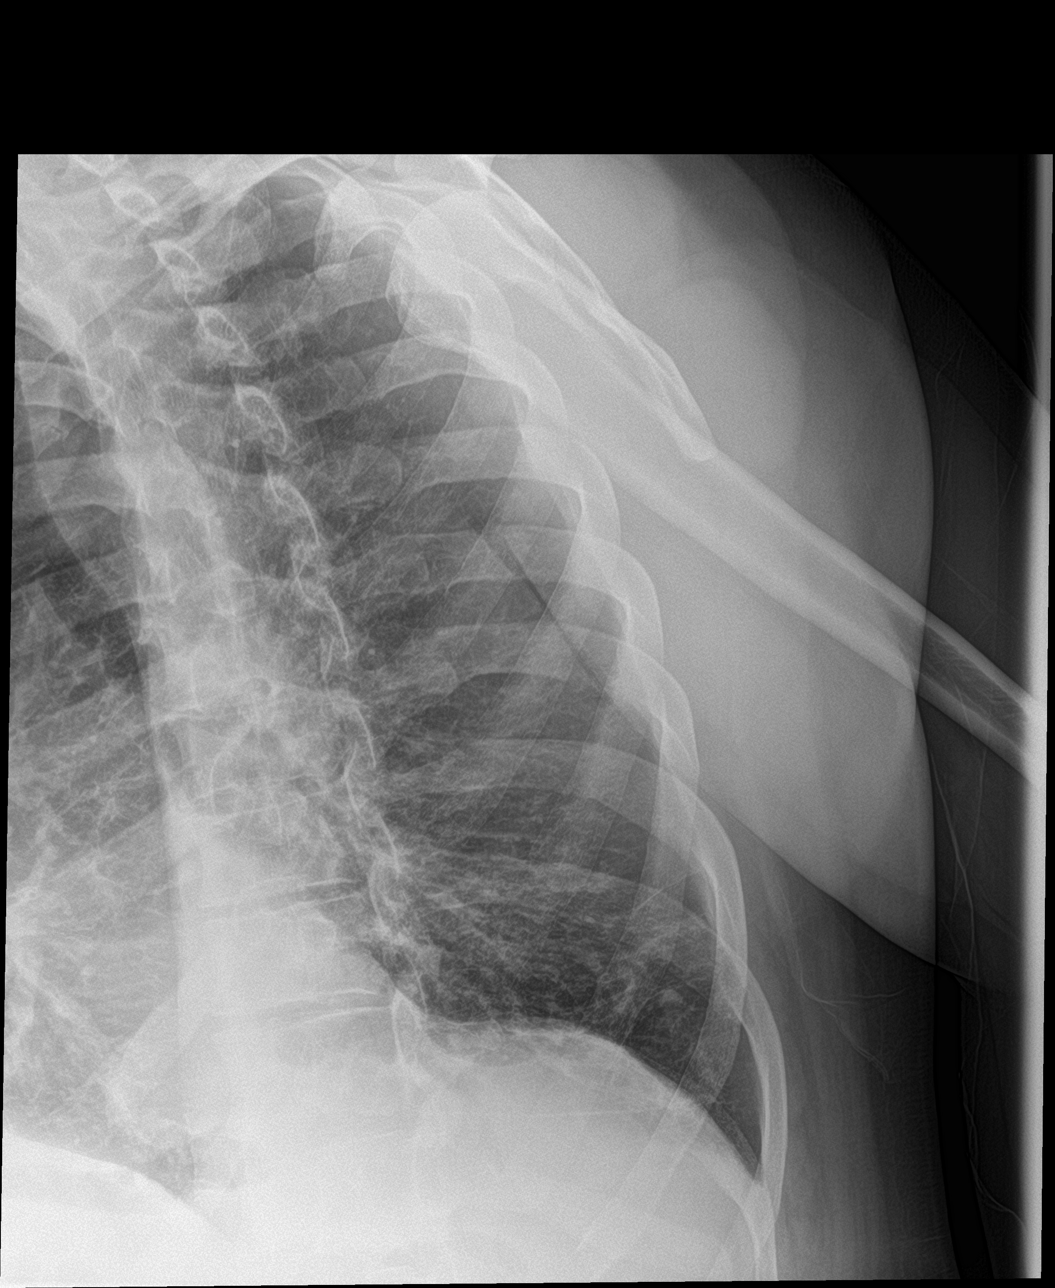

[rib pa obl (2 of 2)]
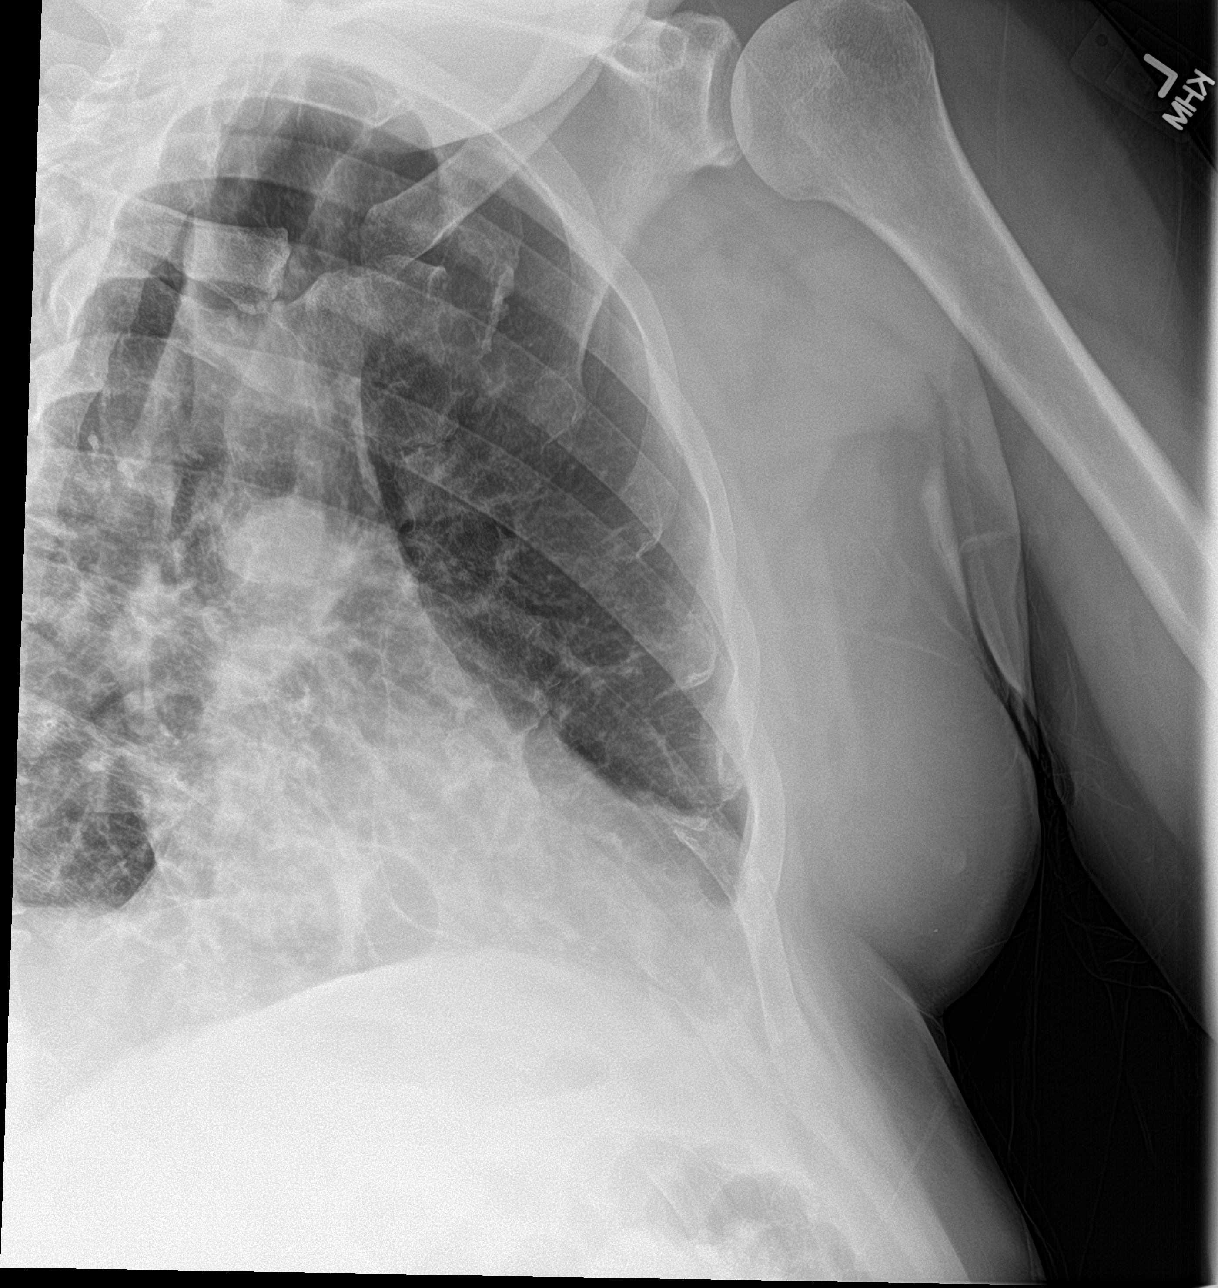

[rib pa]
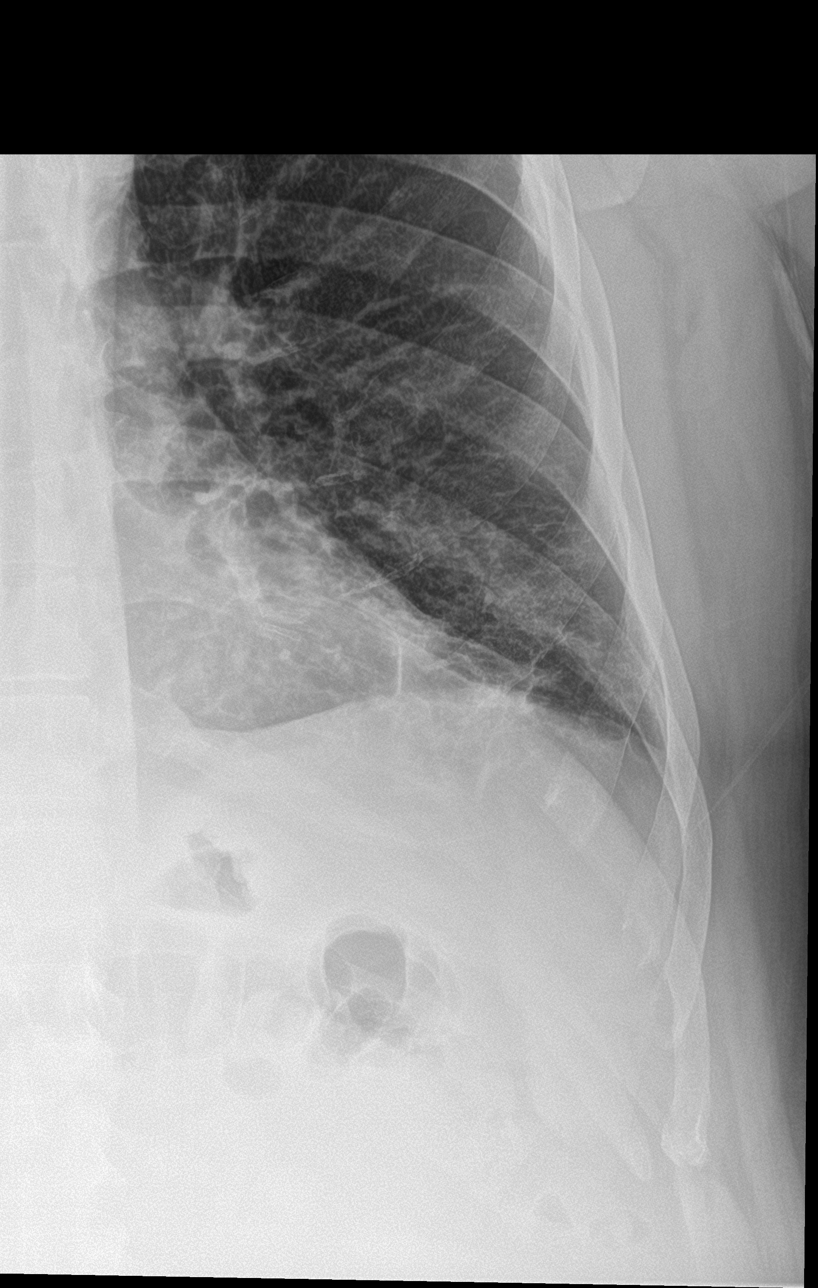

[4 of 4 positions shown; findings below may reference images not displayed]

FINDINGS: Frontal chest as well as oblique and cone-down lateral images were
obtained. There is scarring in the left base. There is interstitial
thickening in the mid lower lung zones bilaterally. There is no
airspace consolidation. There is no appreciable bronchiectatic
change by radiography. Heart is upper normal in size with pulmonary
vascularity within normal limits. No adenopathy.

There is no pleural effusion or pneumothorax. No evident rib
fracture.
IMPRESSION: No evident rib fracture. Scarring left base. Interstitial thickening
bilaterally is likely due to chronic inflammatory type change. No
edema or consolidation. No adenopathy.

## 2017-08-26 ENCOUNTER — Other Ambulatory Visit: Payer: Self-pay | Admitting: Internal Medicine

## 2017-08-26 NOTE — Telephone Encounter (Signed)
Patient needs to be seen for more refills.  Appears he has Medicare now and not clear he meets criteria to be seen here.

## 2017-10-04 ENCOUNTER — Other Ambulatory Visit: Payer: Self-pay | Admitting: Internal Medicine

## 2017-10-07 ENCOUNTER — Other Ambulatory Visit: Payer: Self-pay | Admitting: Internal Medicine

## 2017-10-22 ENCOUNTER — Other Ambulatory Visit: Payer: Self-pay | Admitting: Internal Medicine

## 2017-10-24 ENCOUNTER — Other Ambulatory Visit: Payer: Self-pay | Admitting: Internal Medicine

## 2017-10-25 ENCOUNTER — Telehealth: Payer: Self-pay | Admitting: Internal Medicine

## 2017-10-25 NOTE — Telephone Encounter (Signed)
Irving BurtonEmily, patient's Wife called stating patient needs a Rx on Ventolin HFA 108 (90 Base) MCG/ACT inhaler and on SYMBICORT 160-4.5  MCG/ACT inhaler as well.  Irving Burtonmily will like to be notified when medication are ready to pick up at [phone].  Please advise.

## 2017-10-26 ENCOUNTER — Other Ambulatory Visit: Payer: Self-pay | Admitting: Internal Medicine

## 2017-10-27 NOTE — Telephone Encounter (Signed)
He has not been seen in almost 1 year and was told he needed to find a new provider at his one visit.

## 2017-10-28 NOTE — Telephone Encounter (Signed)
Patient does not meet criteria for our clinic and was supposed to find another provider back on 2018. Spoke to patient today about the Rx request from wife and the pharmacy and states he does not know why pharmacy is requesting Rx. Refill (did not mention wife request at all) . Pt. Aware he does not meet criteria  Called the [facility] and pharmacist states patient no longer goes to that pharmacy and that all prescriptions were tranferred to [place].
